# Patient Record
Sex: Female | Born: 1995
Health system: Southern US, Community
[De-identification: ages and names within clinical notes are randomized; demographics above are authoritative.]

---
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
## 2016-07-07 DIAGNOSIS — H16421 Pannus (corneal), right eye: Secondary | ICD-10-CM | POA: Diagnosis not present

## 2016-07-07 DIAGNOSIS — H10413 Chronic giant papillary conjunctivitis, bilateral: Secondary | ICD-10-CM | POA: Diagnosis not present

## 2016-07-07 DIAGNOSIS — H17821 Peripheral opacity of cornea, right eye: Secondary | ICD-10-CM | POA: Diagnosis not present

## 2016-08-24 ENCOUNTER — Ambulatory Visit (INDEPENDENT_AMBULATORY_CARE_PROVIDER_SITE_OTHER): Payer: BLUE CROSS/BLUE SHIELD | Admitting: Family Medicine

## 2016-08-24 ENCOUNTER — Encounter: Payer: Self-pay | Admitting: Family Medicine

## 2016-08-24 DIAGNOSIS — Z0000 Encounter for general adult medical examination without abnormal findings: Secondary | ICD-10-CM

## 2016-08-24 LAB — BASIC METABOLIC PANEL
BUN: 10 mg/dL (ref 6–23)
CHLORIDE: 102 meq/L (ref 96–112)
CO2: 25 meq/L (ref 19–32)
CREATININE: 0.64 mg/dL (ref 0.40–1.20)
Calcium: 9.6 mg/dL (ref 8.4–10.5)
GFR: 125.72 mL/min (ref >60.00)
Glucose, Bld: 92 mg/dL (ref 70–99)
POTASSIUM: 4.4 meq/L (ref 3.5–5.1)
Sodium: 135 mEq/L (ref 135–145)

## 2016-08-24 LAB — CBC WITH DIFFERENTIAL/PLATELET
BASOS PCT: 1.1 % (ref 0.0–3.0)
Basophils Absolute: 0.1 10*3/uL (ref 0.0–0.1)
EOS ABS: 0.1 10*3/uL (ref 0.0–0.7)
EOS PCT: 2.3 % (ref 0.0–5.0)
HCT: 36.5 % (ref 36.0–46.0)
HEMOGLOBIN: 12.4 g/dL (ref 12.0–15.0)
LYMPHS ABS: 2.1 10*3/uL (ref 0.7–4.0)
Lymphocytes Relative: 34.9 % (ref 12.0–46.0)
MCHC: 34 g/dL (ref 30.0–36.0)
MCV: 75.5 fl — ABNORMAL LOW (ref 78.0–100.0)
MONO ABS: 0.7 10*3/uL (ref 0.1–1.0)
Monocytes Relative: 11.1 % (ref 3.0–12.0)
NEUTROS PCT: 50.6 % (ref 43.0–77.0)
Neutro Abs: 3.1 10*3/uL (ref 1.4–7.7)
Platelets: 282 10*3/uL (ref 150.0–400.0)
RBC: 4.83 Mil/uL (ref 3.87–5.11)
RDW: 15.8 % — AB (ref 11.5–14.6)
WBC: 6.1 10*3/uL (ref 4.5–10.5)

## 2016-08-24 LAB — LIPID PANEL
CHOLESTEROL: 171 mg/dL (ref 0–200)
HDL: 44.3 mg/dL (ref >39.00)
LDL Cholesterol: 101 mg/dL — ABNORMAL HIGH (ref 0–99)
NonHDL: 126.37
TRIGLYCERIDES: 125 mg/dL (ref 0.0–149.0)
Total CHOL/HDL Ratio: 4
VLDL: 25 mg/dL (ref 0.0–40.0)

## 2016-08-24 LAB — HEPATIC FUNCTION PANEL
ALBUMIN: 4.2 g/dL (ref 3.5–5.2)
ALK PHOS: 52 U/L (ref 39–117)
ALT: 11 U/L (ref 0–35)
AST: 22 U/L (ref 0–37)
Bilirubin, Direct: 0.1 mg/dL (ref 0.0–0.3)
Total Bilirubin: 0.4 mg/dL (ref 0.2–1.2)
Total Protein: 7.4 g/dL (ref 6.0–8.3)

## 2016-08-24 LAB — TSH: TSH: 2.33 u[IU]/mL (ref 0.35–5.50)

## 2016-08-24 LAB — VITAMIN D 25 HYDROXY (VIT D DEFICIENCY, FRACTURES): VITD: 13.53 ng/mL — AB (ref 30.00–100.00)

## 2016-08-24 MED ORDER — NORETHINDRONE ACET-ETHINYL EST 1-20 MG-MCG PO TABS: 1.0000 | ORAL_TABLET | Freq: Every day | ORAL | 11 refills | Status: DC

## 2016-08-24 NOTE — Progress Notes (Signed)
   Subjective:    Patient ID: Sara Hartman, female    DOB: 03/17/1996, 21 y.o.   MRN: [id number]  HPI New to establish.  Previous MD- [facility].    CPE- no concerns today.  UTD on immunizations.  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  Not currently on OCPs- interested in starting.  Feeling safe at school, not currently in a relationship.  Drinking at school but not using any drugs.     Review of Systems Patient reports no vision/ hearing changes, adenopathy,fever, weight change,  persistant/recurrent hoarseness , swallowing issues, chest pain, palpitations, edema, persistant/recurrent cough, hemoptysis, dyspnea (rest/exertional/paroxysmal nocturnal), gastrointestinal bleeding (melena, rectal bleeding), abdominal pain, significant heartburn, bowel changes, GU symptoms (dysuria, hematuria, incontinence), Gyn symptoms (abnormal  bleeding, pain),  syncope, focal weakness, memory loss, numbness & tingling, skin/hair/nail changes, abnormal bruising or bleeding, anxiety, or depression.     Objective:   Physical Exam General Appearance:    Alert, cooperative, no distress, appears stated age  Head:    Normocephalic, without obvious abnormality, atraumatic  Eyes:    PERRL, conjunctiva/corneas clear, EOM's intact, fundi    benign, both eyes  Ears:    Normal TM's and external ear canals, both ears  Nose:   Nares normal, septum midline, mucosa normal, no drainage    or sinus tenderness  Throat:   Lips, mucosa, and tongue normal; teeth and gums normal  Neck:   Supple, symmetrical, trachea midline, no adenopathy;    Thyroid: no enlargement/tenderness/nodules  Back:     Symmetric, no curvature, ROM normal, no CVA tenderness  Lungs:     Clear to auscultation bilaterally, respirations unlabored  Chest Wall:    No tenderness or deformity   Heart:    Regular rate and rhythm, S1 and S2 normal, no murmur, rub   or gallop  Breast Exam:    Deferred  Abdomen:     Soft, non-tender, bowel sounds active all four  quadrants,    no masses, no organomegaly  Genitalia:    Deferred  Rectal:    Extremities:   Extremities normal, atraumatic, no cyanosis or edema  Pulses:   2+ and symmetric all extremities  Skin:   Skin color, texture, turgor normal, no rashes or lesions  Lymph nodes:   Cervical, supraclavicular, and axillary nodes normal  Neurologic:   CNII-XII intact, normal strength, sensation and reflexes    throughout          Assessment & Plan:  Physical exam- pt's PE WNL w/ exception of obesity.  Check baseline labs.  Discussed need to start paps next year.  UTD on immunizations.  Start OCPs.  Anticipatory guidance provided.

## 2016-08-24 NOTE — Progress Notes (Signed)
Pre visit review using our clinic review tool, if applicable. No additional management support is needed unless otherwise documented below in the visit note. 

## 2016-08-24 NOTE — Patient Instructions (Signed)
Follow up in 1 year or as needed We'll notify you of your lab results and make any changes if needed Keep up the good work on healthy diet and regular exercise! Start the birth control pills on Sunday after your start your next cycle (likely this Sunday) Call with any questions or concerns Have a great semester! Welcome!  We're glad to have you!

## 2016-08-26 ENCOUNTER — Other Ambulatory Visit: Payer: Self-pay | Admitting: General Practice

## 2016-08-26 ENCOUNTER — Encounter: Payer: Self-pay | Admitting: General Practice

## 2016-08-26 MED ORDER — VITAMIN D (ERGOCALCIFEROL) 1.25 MG (50000 UNIT) PO CAPS: 50000.0000 [IU] | ORAL_CAPSULE | ORAL | 0 refills | Status: DC

## 2016-11-15 ENCOUNTER — Other Ambulatory Visit: Payer: Self-pay | Admitting: Family Medicine

## 2017-01-07 ENCOUNTER — Telehealth: Payer: Self-pay | Admitting: Family Medicine

## 2017-01-07 ENCOUNTER — Encounter: Payer: Self-pay | Admitting: Family Medicine

## 2017-01-07 MED ORDER — NORETHIN-ETH ESTRAD-FE BIPHAS 1 MG-10 MCG / 10 MCG PO TABS: 1.0000 | ORAL_TABLET | Freq: Every day | ORAL | 11 refills | Status: DC

## 2017-01-07 NOTE — Telephone Encounter (Signed)
Pt dropped off school form to be completed for travel in the fall, placed with charge sheet in bin upfront.

## 2017-01-11 ENCOUNTER — Other Ambulatory Visit: Payer: Self-pay | Admitting: General Practice

## 2017-01-11 NOTE — Telephone Encounter (Signed)
Paperwork completed and placed in PCP folder for signature.  

## 2017-01-12 ENCOUNTER — Encounter: Payer: Self-pay | Admitting: Family Medicine

## 2017-01-12 NOTE — Telephone Encounter (Signed)
LMOVM making pt aware that form is ready for pick up.

## 2017-01-12 NOTE — Telephone Encounter (Signed)
Form completed and placed in basket  

## 2017-01-20 ENCOUNTER — Other Ambulatory Visit: Payer: Self-pay | Admitting: General Practice

## 2017-01-20 ENCOUNTER — Other Ambulatory Visit: Payer: Self-pay | Admitting: Family Medicine

## 2017-01-20 DIAGNOSIS — Z308 Encounter for other contraceptive management: Secondary | ICD-10-CM

## 2017-01-20 MED ORDER — NORETHIN-ETH ESTRAD-FE BIPHAS 1 MG-10 MCG / 10 MCG PO TABS: 1.0000 | ORAL_TABLET | Freq: Every day | ORAL | 0 refills | Status: DC

## 2017-03-24 DIAGNOSIS — H10413 Chronic giant papillary conjunctivitis, bilateral: Secondary | ICD-10-CM | POA: Diagnosis not present

## 2017-03-29 DIAGNOSIS — Z01419 Encounter for gynecological examination (general) (routine) without abnormal findings: Secondary | ICD-10-CM | POA: Diagnosis not present

## 2017-03-29 DIAGNOSIS — Z3009 Encounter for other general counseling and advice on contraception: Secondary | ICD-10-CM | POA: Diagnosis not present

## 2017-03-29 DIAGNOSIS — D509 Iron deficiency anemia, unspecified: Secondary | ICD-10-CM | POA: Diagnosis not present

## 2017-04-06 ENCOUNTER — Other Ambulatory Visit: Payer: Self-pay | Admitting: Family Medicine

## 2017-04-06 ENCOUNTER — Encounter: Payer: Self-pay | Admitting: Family Medicine

## 2017-04-06 MED ORDER — NORETHIN-ETH ESTRAD-FE BIPHAS 1 MG-10 MCG / 10 MCG PO TABS: 1.0000 | ORAL_TABLET | Freq: Every day | ORAL | 0 refills | Status: DC

## 2017-04-11 ENCOUNTER — Encounter: Payer: Self-pay | Admitting: Family Medicine

## 2017-04-11 ENCOUNTER — Ambulatory Visit (INDEPENDENT_AMBULATORY_CARE_PROVIDER_SITE_OTHER): Payer: BLUE CROSS/BLUE SHIELD | Admitting: Family Medicine

## 2017-04-11 DIAGNOSIS — L709 Acne, unspecified: Secondary | ICD-10-CM | POA: Insufficient documentation

## 2017-04-11 DIAGNOSIS — L700 Acne vulgaris: Secondary | ICD-10-CM

## 2017-04-11 MED ORDER — BENZOYL PEROXIDE 10 % EX CREA: TOPICAL_CREAM | CUTANEOUS | 1 refills | Status: DC

## 2017-04-11 NOTE — Patient Instructions (Signed)
Follow up as needed Start w/ once daily application of benzoyl peroxide and increase to twice daily as needed Continue to wash your face twice daily Call with any questions or concerns- particularly if excessive redness, peeling, or burning ENJOY YOUR SEMESTER!!

## 2017-04-11 NOTE — Progress Notes (Signed)
Pre visit review using our clinic review tool, if applicable. No additional management support is needed unless otherwise documented below in the visit note. 

## 2017-04-11 NOTE — Assessment & Plan Note (Signed)
New.  Pt has not tried topical OTC products so before jumping to a combination product, will start Benzoyl peroxide.  Pt expressed understanding and is in agreement w/ plan. r

## 2017-04-11 NOTE — Progress Notes (Signed)
   Subjective:    Patient ID: Sara Hartman, female    DOB: 08-Apr-1996, 21 y.o.   MRN: [id number]  HPI Acne- pt reports she has only had intermittent issues w/ acne, typically during warmer weather.  Pediatrician has prescribed topical tx in the past but unsure of what.  Biggest issues is forehead.  No issue w/ body acne.  No issues w/ cystic or infected acne.  Currently using Kiehl's face wash.     Review of Systems For ROS see HPI     Objective:   Physical Exam  Constitutional: She appears well-developed and well-nourished. No distress.  HENT:  Head: Normocephalic and atraumatic.  Skin: Skin is warm and dry.  Mild acne on forehead- closed comedones w/ no sign of inflammation or infection  Vitals reviewed.         Assessment & Plan:

## 2017-04-13 ENCOUNTER — Other Ambulatory Visit: Payer: Self-pay | Admitting: General Practice

## 2017-04-13 MED ORDER — NORETHIN-ETH ESTRAD-FE BIPHAS 1 MG-10 MCG / 10 MCG PO TABS: 1.0000 | ORAL_TABLET | Freq: Every day | ORAL | 3 refills | Status: DC

## 2017-06-19 ENCOUNTER — Other Ambulatory Visit: Payer: Self-pay | Admitting: Family Medicine

## 2017-09-07 ENCOUNTER — Other Ambulatory Visit: Payer: Self-pay | Admitting: Family Medicine

## 2017-09-30 ENCOUNTER — Encounter: Payer: Self-pay | Admitting: General Practice

## 2017-09-30 ENCOUNTER — Encounter: Payer: Self-pay | Admitting: Family Medicine

## 2017-11-30 ENCOUNTER — Other Ambulatory Visit: Payer: Self-pay | Admitting: Family Medicine

## 2018-02-22 ENCOUNTER — Other Ambulatory Visit: Payer: Self-pay | Admitting: Family Medicine

## 2018-05-10 ENCOUNTER — Encounter: Payer: Self-pay | Admitting: Family Medicine

## 2018-05-17 ENCOUNTER — Other Ambulatory Visit: Payer: Self-pay | Admitting: Family Medicine

## 2018-08-04 ENCOUNTER — Other Ambulatory Visit: Payer: Self-pay | Admitting: Family Medicine

## 2018-08-04 NOTE — Telephone Encounter (Signed)
Copied from CRM [id number]. Topic: Quick Communication - Rx Refill/Question >> Aug 04, 2018 10:44 AM [name], [name] wrote: Medication:LO LOESTRIN FE 1 MG-10 MCG / 10 MCG tablet [[id number]][[id number]]   Has the patient contacted their pharmacy? no Preferred Pharmacy (with phone number or street name): [facility] [place] [address] [phone] (Phone) [phone] (Fax)  Agent: Please be advised that RX refills may take up to 3 business days. We ask that you follow-up with your pharmacy.

## 2018-08-04 NOTE — Telephone Encounter (Signed)
Refill request for loestrin FE; refill note dated 05/17/18 prescription refused because pt needs appointment; last office visit 04/11/17;  no upcoming noted; left message on voicemail [phone]

## 2018-08-07 ENCOUNTER — Encounter: Payer: Self-pay | Admitting: Family Medicine

## 2018-08-07 ENCOUNTER — Other Ambulatory Visit: Payer: Self-pay

## 2018-08-07 ENCOUNTER — Ambulatory Visit (INDEPENDENT_AMBULATORY_CARE_PROVIDER_SITE_OTHER): Payer: BLUE CROSS/BLUE SHIELD | Admitting: Family Medicine

## 2018-08-07 VITALS — BP 112/72 | HR 72 | Temp 98.4°F | Resp 16 | Ht 65.0 in | Wt 183.5 lb

## 2018-08-07 DIAGNOSIS — Z6830 Body mass index (BMI) 30.0-30.9, adult: Secondary | ICD-10-CM

## 2018-08-07 DIAGNOSIS — Z0000 Encounter for general adult medical examination without abnormal findings: Secondary | ICD-10-CM

## 2018-08-07 DIAGNOSIS — E6609 Other obesity due to excess calories: Secondary | ICD-10-CM

## 2018-08-07 DIAGNOSIS — E559 Vitamin D deficiency, unspecified: Secondary | ICD-10-CM | POA: Diagnosis not present

## 2018-08-07 MED ORDER — NORETHIN-ETH ESTRAD-FE BIPHAS 1 MG-10 MCG / 10 MCG PO TABS: 1.0000 | ORAL_TABLET | Freq: Every day | ORAL | 1 refills | Status: DC

## 2018-08-07 MED ORDER — NORETHIN-ETH ESTRAD-FE BIPHAS 1 MG-10 MCG / 10 MCG PO TABS: 1.0000 | ORAL_TABLET | Freq: Every day | ORAL | 0 refills | Status: DC

## 2018-08-07 NOTE — Assessment & Plan Note (Signed)
Pt has hx of this.  Check labs and replete prn. 

## 2018-08-07 NOTE — Patient Instructions (Signed)
Follow up in 1 year or as needed We'll notify you of your lab results and make any changes if needed Keep up the good work on healthy diet and regular exercise- you look great! Call and schedule your pap with GYN If you have not had a tetanus shot since 2009, you are due Call with any questions or concerns Happy Holidays!

## 2018-08-07 NOTE — Progress Notes (Signed)
   Subjective:    Patient ID: Sara Hartman, female    DOB: 04/09/1996, 22 y.o.   MRN: [id number]  HPI CPE- Due for Tdap.  Has GYN- due to start pap smears.  UTD on flu shot.   Review of Systems Patient reports no vision/ hearing changes, adenopathy,fever, weight change,  persistant/recurrent hoarseness , swallowing issues, chest pain, palpitations, edema, persistant/recurrent cough, hemoptysis, dyspnea (rest/exertional/paroxysmal nocturnal), gastrointestinal bleeding (melena, rectal bleeding), abdominal pain, significant heartburn, bowel changes, GU symptoms (dysuria, hematuria, incontinence), Gyn symptoms (abnormal  bleeding, pain),  syncope, focal weakness, memory loss, numbness & tingling, skin/hair/nail changes, abnormal bruising or bleeding, anxiety, or depression.     Objective:   Physical Exam General Appearance:    Alert, cooperative, no distress, appears stated age  Head:    Normocephalic, without obvious abnormality, atraumatic  Eyes:    PERRL, conjunctiva/corneas clear, EOM's intact, fundi    benign, both eyes  Ears:    Normal TM's and external ear canals, both ears  Nose:   Nares normal, septum midline, mucosa normal, no drainage    or sinus tenderness  Throat:   Lips, mucosa, and tongue normal; teeth and gums normal  Neck:   Supple, symmetrical, trachea midline, no adenopathy;    Thyroid: no enlargement/tenderness/nodules  Back:     Symmetric, no curvature, ROM normal, no CVA tenderness  Lungs:     Clear to auscultation bilaterally, respirations unlabored  Chest Wall:    No tenderness or deformity   Heart:    Regular rate and rhythm, S1 and S2 normal, no murmur, rub   or gallop  Breast Exam:    Deferred to GYN  Abdomen:     Soft, non-tender, bowel sounds active all four quadrants,    no masses, no organomegaly  Genitalia:    Deferred to GYN  Rectal:    Extremities:   Extremities normal, atraumatic, no cyanosis or edema  Pulses:   2+ and symmetric all extremities  Skin:    Skin color, texture, turgor normal, no rashes or lesions  Lymph nodes:   Cervical, supraclavicular, and axillary nodes normal  Neurologic:   CNII-XII intact, normal strength, sensation and reflexes    throughout          Assessment & Plan:

## 2018-08-07 NOTE — Addendum Note (Signed)
Addended by: Laddie AquasNELSON, Jaiyon Wander A on: 08/07/2018 03:02 PM   Modules accepted: Orders

## 2018-08-07 NOTE — Assessment & Plan Note (Signed)
Pt's PE WNL.  Applauded her weight loss efforts.  Our records show she is due for Tdap- pt wants to check w/ mom first.  Due to start paps- pt will call GYN to schedule.  Check labs.  Anticipatory guidance provided.

## 2018-08-11 ENCOUNTER — Other Ambulatory Visit (INDEPENDENT_AMBULATORY_CARE_PROVIDER_SITE_OTHER): Payer: BLUE CROSS/BLUE SHIELD

## 2018-08-11 ENCOUNTER — Encounter: Payer: Self-pay | Admitting: Family Medicine

## 2018-08-11 DIAGNOSIS — Z23 Encounter for immunization: Secondary | ICD-10-CM | POA: Diagnosis not present

## 2018-08-11 LAB — TSH: TSH: 0.87 u[IU]/mL (ref 0.35–4.50)

## 2018-08-11 LAB — CBC WITH DIFFERENTIAL/PLATELET
Basophils Absolute: 0 10*3/uL (ref 0.0–0.1)
Basophils Relative: 0.5 % (ref 0.0–3.0)
EOS ABS: 0.2 10*3/uL (ref 0.0–0.7)
Eosinophils Relative: 2.7 % (ref 0.0–5.0)
HEMATOCRIT: 41.6 % (ref 36.0–46.0)
Hemoglobin: 13.7 g/dL (ref 12.0–15.0)
LYMPHS ABS: 2.2 10*3/uL (ref 0.7–4.0)
LYMPHS PCT: 38 % (ref 12.0–46.0)
MCHC: 32.9 g/dL (ref 30.0–36.0)
MCV: 85.4 fl (ref 78.0–100.0)
Monocytes Absolute: 0.4 10*3/uL (ref 0.1–1.0)
Monocytes Relative: 7.4 % (ref 3.0–12.0)
NEUTROS ABS: 3 10*3/uL (ref 1.4–7.7)
NEUTROS PCT: 51.4 % (ref 43.0–77.0)
PLATELETS: 218 10*3/uL (ref 150.0–400.0)
RBC: 4.88 Mil/uL (ref 3.87–5.11)
RDW: 14.1 % (ref 11.5–15.5)
WBC: 5.8 10*3/uL (ref 4.0–10.5)

## 2018-08-11 LAB — LIPID PANEL
Cholesterol: 191 mg/dL (ref 0–200)
HDL: 59 mg/dL (ref >39.00)
LDL Cholesterol: 100 mg/dL — ABNORMAL HIGH (ref 0–99)
NONHDL: 131.72
Total CHOL/HDL Ratio: 3
Triglycerides: 160 mg/dL — ABNORMAL HIGH (ref 0.0–149.0)
VLDL: 32 mg/dL (ref 0.0–40.0)

## 2018-08-11 LAB — BASIC METABOLIC PANEL
BUN: 11 mg/dL (ref 6–23)
CHLORIDE: 101 meq/L (ref 96–112)
CO2: 21 meq/L (ref 19–32)
Calcium: 9.6 mg/dL (ref 8.4–10.5)
Creatinine, Ser: 0.71 mg/dL (ref 0.40–1.20)
GFR: 109.43 mL/min (ref >60.00)
Glucose, Bld: 97 mg/dL (ref 70–99)
POTASSIUM: 4.1 meq/L (ref 3.5–5.1)
SODIUM: 132 meq/L — AB (ref 135–145)

## 2018-08-11 LAB — HEPATIC FUNCTION PANEL
ALK PHOS: 44 U/L (ref 39–117)
ALT: 13 U/L (ref 0–35)
AST: 19 U/L (ref 0–37)
Albumin: 4.2 g/dL (ref 3.5–5.2)
BILIRUBIN DIRECT: 0.1 mg/dL (ref 0.0–0.3)
BILIRUBIN TOTAL: 0.4 mg/dL (ref 0.2–1.2)
Total Protein: 7.4 g/dL (ref 6.0–8.3)

## 2018-08-11 LAB — VITAMIN D 25 HYDROXY (VIT D DEFICIENCY, FRACTURES): VITD: 20.92 ng/mL — ABNORMAL LOW (ref 30.00–100.00)

## 2018-08-11 NOTE — Progress Notes (Signed)
Administered Tdap vaccine IM lt arm. Patient tolerated well.

## 2018-08-11 NOTE — Addendum Note (Signed)
Addended by: Laddie AquasNELSON, Dynisha Due A on: 08/11/2018 03:57 PM   Modules accepted: Orders

## 2018-08-14 ENCOUNTER — Other Ambulatory Visit: Payer: Self-pay | Admitting: General Practice

## 2018-08-14 MED ORDER — VITAMIN D (ERGOCALCIFEROL) 1.25 MG (50000 UNIT) PO CAPS: 50000.0000 [IU] | ORAL_CAPSULE | ORAL | 0 refills | Status: DC

## 2018-08-28 ENCOUNTER — Other Ambulatory Visit: Payer: Self-pay | Admitting: Family Medicine

## 2018-12-03 ENCOUNTER — Encounter: Payer: Self-pay | Admitting: Family Medicine

## 2018-12-27 ENCOUNTER — Encounter: Payer: Self-pay | Admitting: Family Medicine

## 2018-12-29 ENCOUNTER — Other Ambulatory Visit: Payer: Self-pay | Admitting: *Deleted

## 2018-12-29 DIAGNOSIS — Z3009 Encounter for other general counseling and advice on contraception: Secondary | ICD-10-CM

## 2019-01-22 ENCOUNTER — Other Ambulatory Visit: Payer: Self-pay | Admitting: Family Medicine

## 2019-02-09 ENCOUNTER — Other Ambulatory Visit: Payer: Self-pay

## 2019-02-09 ENCOUNTER — Ambulatory Visit: Payer: Self-pay | Admitting: *Deleted

## 2019-02-09 ENCOUNTER — Encounter: Payer: Self-pay | Admitting: Family Medicine

## 2019-02-09 ENCOUNTER — Encounter (INDEPENDENT_AMBULATORY_CARE_PROVIDER_SITE_OTHER): Payer: Self-pay

## 2019-02-09 ENCOUNTER — Ambulatory Visit (INDEPENDENT_AMBULATORY_CARE_PROVIDER_SITE_OTHER): Payer: 59 | Admitting: Family Medicine

## 2019-02-09 VITALS — Ht 65.0 in | Wt 175.0 lb

## 2019-02-09 DIAGNOSIS — Z20828 Contact with and (suspected) exposure to other viral communicable diseases: Secondary | ICD-10-CM

## 2019-02-09 DIAGNOSIS — Z20822 Contact with and (suspected) exposure to covid-19: Secondary | ICD-10-CM

## 2019-02-09 NOTE — Telephone Encounter (Signed)
Pt has been scheduled for a VV this afternoon with KT

## 2019-02-09 NOTE — Telephone Encounter (Signed)
Will discuss with PT at appt.

## 2019-02-09 NOTE — Progress Notes (Signed)
I have discussed the procedure for the virtual visit with the patient who has given consent to proceed with assessment and treatment.   [name] Brodmerkel, CMA     

## 2019-02-09 NOTE — Progress Notes (Signed)
   Virtual Visit via Video   I connected with patient on 02/09/19 at  2:20 PM EDT by a video enabled telemedicine application and verified that I am speaking with the correct person using two identifiers.  Location patient: Home Location provider: [facility], Office Persons participating in the virtual visit: Patient, Provider, [place] (Jess B)  I discussed the limitations of evaluation and management by telemedicine and the availability of in person appointments. The patient expressed understanding and agreed to proceed.  Subjective:   HPI:   COVID exposure- pt went to the beach last weekend and one of the girls on the trip was exposed to [place] prior to the beach.  She is waiting on test results.  Pt would like to get tested to know if she is a carrier.  No fever, cough, SOB, HAs, body aches, N/V/D.  ROS:   See pertinent positives and negatives per HPI.  Patient Active Problem List   Diagnosis Date Noted  . Vitamin D deficiency 08/07/2018  . Acne 04/11/2017  . Physical exam 08/24/2016  . Pes planus 02/13/2012  . Patellofemoral arthralgia of both knees 02/11/2012    Social History   Tobacco Use  . Smoking status: Never Smoker  . Smokeless tobacco: Never Used  Substance Use Topics  . Alcohol use: Yes    Comment: socially at school    Current Outpatient Medications:  .  levonorgestrel (MIRENA) 20 MCG/24HR IUD, 1 each by Intrauterine route once., Disp: , Rfl:   Not on File  Objective:   Ht 5\' 5"  (1.651 m)   Wt 175 lb (79.4 kg)   BMI 29.12 kg/m   AAOx3, NAD NCAT, EOMI No obvious CN deficits Coloring WNL Pt is able to speak clearly, coherently without shortness of breath or increased work of breathing.  Thought process is linear.  Mood is appropriate.   Assessment and Plan:   Exposure to COVID- pt is currently asymptomatic but someone she travelled w/ has known exposure and is currently waiting on test results.  Pt is asking for testing.  Order sent to [facility]  and pt was enrolled in home monitoring program.  Pt is aware she needs to self isolate while waiting for test results.   Annye Asa, MD 02/09/2019

## 2019-02-09 NOTE — Telephone Encounter (Signed)
  Pt called in requesting to be tested for COVID-19 because her co-worker was exposed to someone that was positive for COVID-19.   Pt has been in contact with her co-worker.   She does not have any symptoms.  I sent this note to the office of Dr. Birdie Riddle.   I let the pt know as soon as the office opened someone would be contacting her.  I sent my notes to the office for further disposition.  Reason for Disposition . [1] COVID-19 EXPOSURE (Close Contact) AND [2] within last 14 days BUT [3] NO symptoms  Answer Assessment - Initial Assessment Questions 1. CLOSE CONTACT: "Who is the person with the confirmed or suspected COVID-19 infection that you were exposed to?"     My co-worker was exposed to someone who is positive for the COVID-19 and I've been in close contact with my co-worker.   I don't have any symptoms but I want to be tested to be sure I don't have it. 2. PLACE of CONTACT: "Where were you when you were exposed to COVID-19?" (e.g., home, school, medical waiting room; which city?)     My co-worker 3. TYPE of CONTACT: "How much contact was there?" (e.g., sitting next to, live in same house, work in same office, same building)     On my job 4. DURATION of CONTACT: "How long were you in contact with the COVID-19 patient?" (e.g., a few seconds, passed by person, a few minutes, live with the patient)     Daily at work 5. DATE of CONTACT: "When did you have contact with a COVID-19 patient?" (e.g., how many days ago)     Work 6. TRAVEL: "Have you traveled out of the country recently?" If so, "When and where?"     * Also ask about out-of-state travel, since the CDC has identified some high-risk cities for community spread in the [place].     * Note: Travel becomes less relevant if there is widespread community transmission where the patient lives.     No 7. COMMUNITY SPREAD: "Are there lots of cases of COVID-19 (community spread) where you live?" (See public health department website, if unsure)       Yes 8. SYMPTOMS: "Do you have any symptoms?" (e.g., fever, cough, breathing difficulty)     No 9. PREGNANCY OR POSTPARTUM: "Is there any chance you are pregnant?" "When was your last menstrual period?" "Did you deliver in the last 2 weeks?"     Not asked 10. HIGH RISK: "Do you have any heart or lung problems? Do you have a weak immune system?" (e.g., CHF, COPD, asthma, HIV positive, chemotherapy, renal failure, diabetes mellitus, sickle cell anemia)       No  Protocols used: CORONAVIRUS (COVID-19) EXPOSURE-A-AH

## 2019-02-10 ENCOUNTER — Telehealth: Payer: Self-pay

## 2019-02-10 ENCOUNTER — Encounter (INDEPENDENT_AMBULATORY_CARE_PROVIDER_SITE_OTHER): Payer: Self-pay

## 2019-02-10 DIAGNOSIS — Z20822 Contact with and (suspected) exposure to covid-19: Secondary | ICD-10-CM

## 2019-02-10 NOTE — Telephone Encounter (Signed)
Called pt and scheduled Covid 19 testing. Pt given appt 0930 June 29th. Pt informed of address, wear a mask to testing site and that this is a drive up testing and to stay in car.

## 2019-02-11 ENCOUNTER — Encounter (INDEPENDENT_AMBULATORY_CARE_PROVIDER_SITE_OTHER): Payer: Self-pay

## 2019-02-12 ENCOUNTER — Encounter (INDEPENDENT_AMBULATORY_CARE_PROVIDER_SITE_OTHER): Payer: Self-pay

## 2019-02-12 ENCOUNTER — Other Ambulatory Visit: Payer: 59

## 2019-02-12 DIAGNOSIS — Z20822 Contact with and (suspected) exposure to covid-19: Secondary | ICD-10-CM

## 2019-02-13 ENCOUNTER — Encounter (INDEPENDENT_AMBULATORY_CARE_PROVIDER_SITE_OTHER): Payer: Self-pay

## 2019-02-14 ENCOUNTER — Encounter (INDEPENDENT_AMBULATORY_CARE_PROVIDER_SITE_OTHER): Payer: Self-pay

## 2019-02-15 ENCOUNTER — Encounter (INDEPENDENT_AMBULATORY_CARE_PROVIDER_SITE_OTHER): Payer: Self-pay

## 2019-02-15 LAB — NOVEL CORONAVIRUS, NAA: SARS-CoV-2, NAA: NOT DETECTED

## 2019-02-19 ENCOUNTER — Encounter (INDEPENDENT_AMBULATORY_CARE_PROVIDER_SITE_OTHER): Payer: Self-pay

## 2019-08-14 ENCOUNTER — Encounter: Payer: Self-pay | Admitting: Family Medicine

## 2019-08-14 ENCOUNTER — Other Ambulatory Visit: Payer: Self-pay

## 2019-08-14 ENCOUNTER — Ambulatory Visit (INDEPENDENT_AMBULATORY_CARE_PROVIDER_SITE_OTHER): Payer: 59 | Admitting: Family Medicine

## 2019-08-14 VITALS — BP 98/78 | HR 74 | Temp 97.2°F | Resp 16 | Ht 65.0 in | Wt 185.4 lb

## 2019-08-14 DIAGNOSIS — E559 Vitamin D deficiency, unspecified: Secondary | ICD-10-CM | POA: Diagnosis not present

## 2019-08-14 DIAGNOSIS — Z0000 Encounter for general adult medical examination without abnormal findings: Secondary | ICD-10-CM | POA: Diagnosis not present

## 2019-08-14 DIAGNOSIS — E669 Obesity, unspecified: Secondary | ICD-10-CM | POA: Diagnosis not present

## 2019-08-14 LAB — BASIC METABOLIC PANEL
BUN: 10 mg/dL (ref 6–23)
CO2: 29 mEq/L (ref 19–32)
Calcium: 9.5 mg/dL (ref 8.4–10.5)
Chloride: 103 mEq/L (ref 96–112)
Creatinine, Ser: 0.65 mg/dL (ref 0.40–1.20)
GFR: 112.97 mL/min (ref >60.00)
Glucose, Bld: 87 mg/dL (ref 70–99)
Potassium: 4.4 mEq/L (ref 3.5–5.1)
Sodium: 136 mEq/L (ref 135–145)

## 2019-08-14 LAB — HEPATIC FUNCTION PANEL
ALT: 11 U/L (ref 0–35)
AST: 15 U/L (ref 0–37)
Albumin: 4.3 g/dL (ref 3.5–5.2)
Alkaline Phosphatase: 57 U/L (ref 39–117)
Bilirubin, Direct: 0.1 mg/dL (ref 0.0–0.3)
Total Bilirubin: 0.5 mg/dL (ref 0.2–1.2)
Total Protein: 6.9 g/dL (ref 6.0–8.3)

## 2019-08-14 LAB — CBC WITH DIFFERENTIAL/PLATELET
Basophils Absolute: 0 10*3/uL (ref 0.0–0.1)
Basophils Relative: 0.6 % (ref 0.0–3.0)
Eosinophils Absolute: 0.2 10*3/uL (ref 0.0–0.7)
Eosinophils Relative: 4.2 % (ref 0.0–5.0)
HCT: 38.9 % (ref 36.0–46.0)
Hemoglobin: 12.7 g/dL (ref 12.0–15.0)
Lymphocytes Relative: 41.2 % (ref 12.0–46.0)
Lymphs Abs: 2 10*3/uL (ref 0.7–4.0)
MCHC: 32.6 g/dL (ref 30.0–36.0)
MCV: 85 fl (ref 78.0–100.0)
Monocytes Absolute: 0.4 10*3/uL (ref 0.1–1.0)
Monocytes Relative: 8.9 % (ref 3.0–12.0)
Neutro Abs: 2.2 10*3/uL (ref 1.4–7.7)
Neutrophils Relative %: 45.1 % (ref 43.0–77.0)
Platelets: 227 10*3/uL (ref 150.0–400.0)
RBC: 4.58 Mil/uL (ref 3.87–5.11)
RDW: 14.1 % (ref 11.5–15.5)
WBC: 4.8 10*3/uL (ref 4.0–10.5)

## 2019-08-14 LAB — LIPID PANEL
Cholesterol: 179 mg/dL (ref 0–200)
HDL: 56.3 mg/dL (ref >39.00)
LDL Cholesterol: 101 mg/dL — ABNORMAL HIGH (ref 0–99)
NonHDL: 122.55
Total CHOL/HDL Ratio: 3
Triglycerides: 107 mg/dL (ref 0.0–149.0)
VLDL: 21.4 mg/dL (ref 0.0–40.0)

## 2019-08-14 LAB — TSH: TSH: 0.93 u[IU]/mL (ref 0.35–4.50)

## 2019-08-14 LAB — VITAMIN D 25 HYDROXY (VIT D DEFICIENCY, FRACTURES): VITD: 12.46 ng/mL — ABNORMAL LOW (ref 30.00–100.00)

## 2019-08-14 NOTE — Assessment & Plan Note (Signed)
Pt has gained 10 lbs since last visit.  Encouraged healthy diet and regular exercise.  Check labs to risk stratify.  Will follow. ?

## 2019-08-14 NOTE — Assessment & Plan Note (Signed)
Pt's PE WNL w/ exception of obesity.  UTD on immunizations, GYN.  Check labs.  Anticipatory guidance provided.

## 2019-08-14 NOTE — Patient Instructions (Signed)
Follow up in 1 year or as needed ?We'll notify you of your lab results and make any changes if needed ?Keep up the good work on healthy diet and regular exercise- you're doing great!!! ?Call with any questions or concerns ?Stay Safe!  Stay Healthy! ?Happy Early Birthday!!! ?

## 2019-08-14 NOTE — Progress Notes (Signed)
   Subjective:    Patient ID: Sara Hartman, female    DOB: 1996-06-30, 23 y.o.   MRN: [id number]  HPI CPE- UTD on immunizations, GYN.  Living in [place], doing political research.  Living w/ roomates- safe in living situation.  Not currently dating.  No concerns for STIs.  Continues to exercise 4-5x/week.  Not smoking or vaping.  No drug use.  Alcohol in moderation.  No concerns today.   Review of Systems Patient reports no vision/ hearing changes, adenopathy,fever, weight change,  persistant/recurrent hoarseness , swallowing issues, chest pain, palpitations, edema, persistant/recurrent cough, hemoptysis, dyspnea (rest/exertional/paroxysmal nocturnal), gastrointestinal bleeding (melena, rectal bleeding), abdominal pain, significant heartburn, bowel changes, GU symptoms (dysuria, hematuria, incontinence), Gyn symptoms (abnormal  bleeding, pain),  syncope, focal weakness, memory loss, numbness & tingling, skin/hair/nail changes, abnormal bruising or bleeding, anxiety, or depression.   This visit occurred during the SARS-CoV-2 public health emergency.  Safety protocols were in place, including screening questions prior to the visit, additional usage of staff PPE, and extensive cleaning of exam room while observing appropriate contact time as indicated for disinfecting solutions.       Objective:   Physical Exam General Appearance:    Alert, cooperative, no distress, appears stated age  Head:    Normocephalic, without obvious abnormality, atraumatic  Eyes:    PERRL, conjunctiva/corneas clear, EOM's intact, fundi    benign, both eyes  Ears:    Normal TM's and external ear canals, both ears  Nose:   Deferred due to COVID  Throat:   Neck:   Supple, symmetrical, trachea midline, no adenopathy;    Thyroid: no enlargement/tenderness/nodules  Back:     Symmetric, no curvature, ROM normal, no CVA tenderness  Lungs:     Clear to auscultation bilaterally, respirations unlabored  Chest Wall:    No tenderness or  deformity   Heart:    Regular rate and rhythm, S1 and S2 normal, no murmur, rub   or gallop  Breast Exam:    Deferred to GYN  Abdomen:     Soft, non-tender, bowel sounds active all four quadrants,    no masses, no organomegaly  Genitalia:    Deferred to GYN  Rectal:    Extremities:   Extremities normal, atraumatic, no cyanosis or edema  Pulses:   2+ and symmetric all extremities  Skin:   Skin color, texture, turgor normal, no rashes or lesions  Lymph nodes:   Cervical, supraclavicular, and axillary nodes normal  Neurologic:   CNII-XII intact, normal strength, sensation and reflexes    throughout          Assessment & Plan:

## 2019-08-14 NOTE — Assessment & Plan Note (Signed)
Pt has hx of this.  Check labs and replete prn. 

## 2019-08-15 ENCOUNTER — Other Ambulatory Visit: Payer: Self-pay | Admitting: General Practice

## 2019-08-15 MED ORDER — VITAMIN D (ERGOCALCIFEROL) 1.25 MG (50000 UNIT) PO CAPS: 50000.0000 [IU] | ORAL_CAPSULE | ORAL | 0 refills | Status: DC

## 2019-11-06 ENCOUNTER — Other Ambulatory Visit: Payer: Self-pay | Admitting: Family Medicine

## 2020-08-14 ENCOUNTER — Encounter: Payer: 59 | Admitting: Family Medicine

## 2020-11-28 ENCOUNTER — Encounter: Payer: 59 | Admitting: Family Medicine

## 2020-12-30 ENCOUNTER — Encounter: Payer: Self-pay | Admitting: Family Medicine

## 2020-12-30 ENCOUNTER — Ambulatory Visit (INDEPENDENT_AMBULATORY_CARE_PROVIDER_SITE_OTHER): Payer: 59 | Admitting: Family Medicine

## 2020-12-30 ENCOUNTER — Other Ambulatory Visit: Payer: Self-pay

## 2020-12-30 VITALS — BP 117/80 | HR 81 | Temp 97.3°F | Resp 20 | Ht 65.0 in | Wt 206.6 lb

## 2020-12-30 DIAGNOSIS — E559 Vitamin D deficiency, unspecified: Secondary | ICD-10-CM

## 2020-12-30 DIAGNOSIS — Z0000 Encounter for general adult medical examination without abnormal findings: Secondary | ICD-10-CM | POA: Diagnosis not present

## 2020-12-30 DIAGNOSIS — E669 Obesity, unspecified: Secondary | ICD-10-CM

## 2020-12-30 DIAGNOSIS — Z114 Encounter for screening for human immunodeficiency virus [HIV]: Secondary | ICD-10-CM | POA: Diagnosis not present

## 2020-12-30 DIAGNOSIS — Z1159 Encounter for screening for other viral diseases: Secondary | ICD-10-CM

## 2020-12-30 LAB — HEPATIC FUNCTION PANEL
ALT: 11 U/L (ref 0–35)
AST: 12 U/L (ref 0–37)
Albumin: 4.2 g/dL (ref 3.5–5.2)
Alkaline Phosphatase: 54 U/L (ref 39–117)
Bilirubin, Direct: 0.1 mg/dL (ref 0.0–0.3)
Total Bilirubin: 0.4 mg/dL (ref 0.2–1.2)
Total Protein: 6.7 g/dL (ref 6.0–8.3)

## 2020-12-30 LAB — CBC WITH DIFFERENTIAL/PLATELET
Basophils Absolute: 0 10*3/uL (ref 0.0–0.1)
Basophils Relative: 0.4 % (ref 0.0–3.0)
Eosinophils Absolute: 0.1 10*3/uL (ref 0.0–0.7)
Eosinophils Relative: 2.1 % (ref 0.0–5.0)
HCT: 37 % (ref 36.0–46.0)
Hemoglobin: 12.4 g/dL (ref 12.0–15.0)
Lymphocytes Relative: 33.1 % (ref 12.0–46.0)
Lymphs Abs: 2.4 10*3/uL (ref 0.7–4.0)
MCHC: 33.5 g/dL (ref 30.0–36.0)
MCV: 84.8 fl (ref 78.0–100.0)
Monocytes Absolute: 0.6 10*3/uL (ref 0.1–1.0)
Monocytes Relative: 8.4 % (ref 3.0–12.0)
Neutro Abs: 4 10*3/uL (ref 1.4–7.7)
Neutrophils Relative %: 56 % (ref 43.0–77.0)
Platelets: 239 10*3/uL (ref 150.0–400.0)
RBC: 4.36 Mil/uL (ref 3.87–5.11)
RDW: 14 % (ref 11.5–15.5)
WBC: 7.1 10*3/uL (ref 4.0–10.5)

## 2020-12-30 LAB — VITAMIN D 25 HYDROXY (VIT D DEFICIENCY, FRACTURES): VITD: 9.47 ng/mL — ABNORMAL LOW (ref 30.00–100.00)

## 2020-12-30 LAB — LIPID PANEL
Cholesterol: 163 mg/dL (ref 0–200)
HDL: 61.8 mg/dL (ref >39.00)
LDL Cholesterol: 82 mg/dL (ref 0–99)
NonHDL: 101.02
Total CHOL/HDL Ratio: 3
Triglycerides: 95 mg/dL (ref 0.0–149.0)
VLDL: 19 mg/dL (ref 0.0–40.0)

## 2020-12-30 LAB — BASIC METABOLIC PANEL
BUN: 11 mg/dL (ref 6–23)
CO2: 24 mEq/L (ref 19–32)
Calcium: 9.1 mg/dL (ref 8.4–10.5)
Chloride: 102 mEq/L (ref 96–112)
Creatinine, Ser: 0.62 mg/dL (ref 0.40–1.20)
GFR: 124.69 mL/min (ref >60.00)
Glucose, Bld: 79 mg/dL (ref 70–99)
Potassium: 4 mEq/L (ref 3.5–5.1)
Sodium: 137 mEq/L (ref 135–145)

## 2020-12-30 LAB — TSH: TSH: 2.43 u[IU]/mL (ref 0.35–4.50)

## 2020-12-30 MED ORDER — VITAMIN D (ERGOCALCIFEROL) 1.25 MG (50000 UNIT) PO CAPS: 50000.0000 [IU] | ORAL_CAPSULE | ORAL | 0 refills | Status: DC

## 2020-12-30 NOTE — Progress Notes (Signed)
   Subjective:    Patient ID: Canary Brim, female    DOB: Mar 16, 1996, 25 y.o.   MRN: [id number]  HPI CPE- UTD on Tdap, COVID, pap.  Due for Hep C and HIV screens  Reviewed past medical, surgical, family and social histories.   Health Maintenance  Topic Date Due  . HIV Screening  Never done  . Hepatitis C Screening  Never done  . INFLUENZA VACCINE  03/16/2021  . PAP-Cervical Cytology Screening  02/11/2022  . PAP SMEAR-Modifier  02/11/2022  . TETANUS/TDAP  08/11/2028  . HPV VACCINES  Completed  . COVID-19 Vaccine  Completed      Review of Systems Patient reports no vision/ hearing changes, adenopathy,fever,  persistant/recurrent hoarseness , swallowing issues, chest pain, palpitations, edema, persistant/recurrent cough, hemoptysis, dyspnea (rest/exertional/paroxysmal nocturnal), gastrointestinal bleeding (melena, rectal bleeding), abdominal pain, significant heartburn, bowel changes, GU symptoms (dysuria, hematuria, incontinence), Gyn symptoms (abnormal  bleeding, pain),  syncope, focal weakness, memory loss, numbness & tingling, skin/hair/nail changes, abnormal bruising or bleeding, anxiety, or depression.   + 20 lb weight gain  This visit occurred during the SARS-CoV-2 public health emergency.  Safety protocols were in place, including screening questions prior to the visit, additional usage of staff PPE, and extensive cleaning of exam room while observing appropriate contact time as indicated for disinfecting solutions.       Objective:   Physical Exam General Appearance:    Alert, cooperative, no distress, appears stated age, obese  Head:    Normocephalic, without obvious abnormality, atraumatic  Eyes:    PERRL, conjunctiva/corneas clear, EOM's intact, fundi    benign, both eyes  Ears:    Normal TM's and external ear canals, both ears  Nose:   Deferred due to COVID  Throat:   Neck:   Supple, symmetrical, trachea midline, no adenopathy;    Thyroid: no  enlargement/tenderness/nodules  Back:     Symmetric, no curvature, ROM normal, no CVA tenderness  Lungs:     Clear to auscultation bilaterally, respirations unlabored  Chest Wall:    No tenderness or deformity   Heart:    Regular rate and rhythm, S1 and S2 normal, no murmur, rub   or gallop  Breast Exam:    Deferred to GYN  Abdomen:     Soft, non-tender, bowel sounds active all four quadrants,    no masses, no organomegaly  Genitalia:    Deferred to GYN  Rectal:    Extremities:   Extremities normal, atraumatic, no cyanosis or edema  Pulses:   2+ and symmetric all extremities  Skin:   Skin color, texture, turgor normal, no rashes or lesions  Lymph nodes:   Cervical, supraclavicular, and axillary nodes normal  Neurologic:   CNII-XII intact, normal strength, sensation and reflexes    throughout          Assessment & Plan:

## 2020-12-30 NOTE — Patient Instructions (Signed)
Follow up in 1 year or as needed We'll notify you of your lab results and make any changes if needed Continue to work on healthy diet and regular exercise- you can do it! Call with any questions or concerns Stay Safe!  Stay Healthy! Have a great summer!!! 

## 2020-12-30 NOTE — Assessment & Plan Note (Signed)
Pt has gained 20 lbs since last visit.  Encouraged healthy diet and regular exercise.  Check labs to risk stratify.  Will follow.

## 2020-12-30 NOTE — Assessment & Plan Note (Signed)
Pt has hx of this.  Check labs and replete prn. 

## 2020-12-30 NOTE — Assessment & Plan Note (Signed)
Pt's PE WNL w/ exception of obesity.  UTD on Tdap, COVID, pap.  Check labs.  Anticipatory guidance provided.

## 2020-12-31 LAB — HEPATITIS C ANTIBODY
Hepatitis C Ab: NONREACTIVE
SIGNAL TO CUT-OFF: 0.01 (ref <1.00)

## 2020-12-31 LAB — HIV ANTIBODY (ROUTINE TESTING W REFLEX): HIV 1&2 Ab, 4th Generation: NONREACTIVE

## 2021-01-01 ENCOUNTER — Encounter: Payer: 59 | Admitting: Family Medicine

## 2021-01-13 ENCOUNTER — Telehealth: Payer: Self-pay | Admitting: Family Medicine

## 2021-01-13 ENCOUNTER — Other Ambulatory Visit: Payer: Self-pay

## 2021-01-13 MED ORDER — VITAMIN D (ERGOCALCIFEROL) 1.25 MG (50000 UNIT) PO CAPS: 50000.0000 [IU] | ORAL_CAPSULE | ORAL | 0 refills | Status: AC

## 2021-01-13 NOTE — Telephone Encounter (Signed)
Medication sent to pharmacy  

## 2021-01-13 NOTE — Telephone Encounter (Signed)
Pt called in asking if the Vit D can be sent into the [facility] of [facility] on [address].  Please advise

## 2021-02-11 ENCOUNTER — Encounter: Payer: Self-pay | Admitting: *Deleted

## 2021-07-18 ENCOUNTER — Encounter: Payer: Self-pay | Admitting: Family Medicine

## 2022-03-15 ENCOUNTER — Ambulatory Visit (INDEPENDENT_AMBULATORY_CARE_PROVIDER_SITE_OTHER): Payer: 59 | Admitting: Family Medicine

## 2022-03-15 ENCOUNTER — Encounter: Payer: Self-pay | Admitting: Family Medicine

## 2022-03-15 VITALS — BP 130/80 | HR 75 | Temp 98.6°F | Resp 16 | Ht 66.0 in | Wt 225.4 lb

## 2022-03-15 DIAGNOSIS — E669 Obesity, unspecified: Secondary | ICD-10-CM | POA: Diagnosis not present

## 2022-03-15 DIAGNOSIS — Z0000 Encounter for general adult medical examination without abnormal findings: Secondary | ICD-10-CM

## 2022-03-15 DIAGNOSIS — E559 Vitamin D deficiency, unspecified: Secondary | ICD-10-CM | POA: Diagnosis not present

## 2022-03-15 LAB — CBC WITH DIFFERENTIAL/PLATELET
Basophils Absolute: 0 10*3/uL (ref 0.0–0.1)
Basophils Relative: 0.5 % (ref 0.0–3.0)
Eosinophils Absolute: 0.2 10*3/uL (ref 0.0–0.7)
Eosinophils Relative: 2.3 % (ref 0.0–5.0)
HCT: 38.7 % (ref 36.0–46.0)
Hemoglobin: 12.8 g/dL (ref 12.0–15.0)
Lymphocytes Relative: 27.7 % (ref 12.0–46.0)
Lymphs Abs: 1.8 10*3/uL (ref 0.7–4.0)
MCHC: 33.1 g/dL (ref 30.0–36.0)
MCV: 87 fl (ref 78.0–100.0)
Monocytes Absolute: 0.7 10*3/uL (ref 0.1–1.0)
Monocytes Relative: 10.7 % (ref 3.0–12.0)
Neutro Abs: 3.9 10*3/uL (ref 1.4–7.7)
Neutrophils Relative %: 58.8 % (ref 43.0–77.0)
Platelets: 242 10*3/uL (ref 150.0–400.0)
RBC: 4.44 Mil/uL (ref 3.87–5.11)
RDW: 14.1 % (ref 11.5–15.5)
WBC: 6.6 10*3/uL (ref 4.0–10.5)

## 2022-03-15 LAB — VITAMIN D 25 HYDROXY (VIT D DEFICIENCY, FRACTURES): VITD: 12.1 ng/mL — ABNORMAL LOW (ref 30.00–100.00)

## 2022-03-15 LAB — TSH: TSH: 1.4 u[IU]/mL (ref 0.35–5.50)

## 2022-03-15 MED ORDER — FLUCONAZOLE 150 MG PO TABS: 150.0000 mg | ORAL_TABLET | Freq: Once | ORAL | 0 refills | Status: AC

## 2022-03-15 NOTE — Assessment & Plan Note (Signed)
Check labs and replete prn. 

## 2022-03-15 NOTE — Progress Notes (Signed)
   Subjective:    Patient ID: Sara Hartman, female    DOB: [birth year], 26 y.o.   MRN: [id number]  HPI CPE- pt reports pap in 2021 ([facility]).  No concerns today.  Health Maintenance  Topic Date Due   PAP-Cervical Cytology Screening  02/11/2022   PAP SMEAR-Modifier  02/11/2022   INFLUENZA VACCINE  03/16/2022   TETANUS/TDAP  08/11/2028   HPV VACCINES  Completed   Hepatitis C Screening  Completed   HIV Screening  Completed   COVID-19 Vaccine  Discontinued      Review of Systems Patient reports no vision changes, adenopathy,fever, persistant/recurrent hoarseness , swallowing issues, chest pain, palpitations, edema, persistant/recurrent cough, hemoptysis, dyspnea (rest/exertional/paroxysmal nocturnal), gastrointestinal bleeding (melena, rectal bleeding), abdominal pain, significant heartburn, bowel changes, GU symptoms (dysuria, hematuria, incontinence), Gyn symptoms (abnormal  bleeding, pain),  syncope, focal weakness, memory loss, numbness & tingling, skin/hair/nail changes, abnormal bruising or bleeding, anxiety, or depression.   + 18 lb weight gain- exercising regularly, weight lifting 3-4x/week and walking + mild hearing loss    Objective:   Physical Exam General Appearance:    Alert, cooperative, no distress, appears stated age, obese  Head:    Normocephalic, without obvious abnormality, atraumatic  Eyes:    PERRL, conjunctiva/corneas clear, EOM's intact both eyes  Ears:    Normal TM's and external ear canals, both ears  Nose:   Nares normal, septum midline, mucosa normal, no drainage    or sinus tenderness  Throat:   Lips, mucosa, and tongue normal; teeth and gums normal  Neck:   Supple, symmetrical, trachea midline, no adenopathy;    Thyroid: no enlargement/tenderness/nodules  Back:     Symmetric, no curvature, ROM normal, no CVA tenderness  Lungs:     Clear to auscultation bilaterally, respirations unlabored  Chest Wall:    No tenderness or deformity   Heart:     Regular rate and rhythm, S1 and S2 normal, no murmur, rub   or gallop  Breast Exam:    Deferred to GYN  Abdomen:     Soft, non-tender, bowel sounds active all four quadrants,    no masses, no organomegaly  Genitalia:    Deferred to GYN  Rectal:    Extremities:   Extremities normal, atraumatic, no cyanosis or edema  Pulses:   2+ and symmetric all extremities  Skin:   Skin color, texture, turgor normal, no rashes or lesions  Lymph nodes:   Cervical, supraclavicular, and axillary nodes normal  Neurologic:   CNII-XII intact, normal strength, sensation and reflexes    throughout          Assessment & Plan:

## 2022-03-15 NOTE — Assessment & Plan Note (Signed)
Deteriorated.  Pt has gained 18 lbs since last visit.  She reports weight lifting 3-4x/week and walking regularly.  Encouraged her to continue and to focus on low carb/low sugar diet.  Check labs to risk stratify.  Will follow.

## 2022-03-15 NOTE — Assessment & Plan Note (Signed)
Pt's PE WNL w/ exception of obesity.  UTD on pap ([facility])- will attempt to get records.  Check labs.  Anticipatory guidance provided.

## 2022-03-15 NOTE — Patient Instructions (Signed)
Follow up in 1 year or as needed We'll notify you of your lab results and make any changes if needed Continue to work on healthy diet and regular exercise- you can do it! Drip some peroxide in your ears every so often to dissolve any wax Call with any questions or concerns Stay Safe!  Stay Healthy! Enjoy the rest of your summer!!!

## 2022-03-16 ENCOUNTER — Other Ambulatory Visit: Payer: Self-pay

## 2022-03-16 ENCOUNTER — Telehealth: Payer: Self-pay

## 2022-03-16 ENCOUNTER — Telehealth: Payer: Self-pay | Admitting: Family Medicine

## 2022-03-16 LAB — HEPATIC FUNCTION PANEL
ALT: 16 U/L (ref 0–35)
AST: 19 U/L (ref 0–37)
Albumin: 4.3 g/dL (ref 3.5–5.2)
Alkaline Phosphatase: 46 U/L (ref 39–117)
Bilirubin, Direct: 0 mg/dL (ref 0.0–0.3)
Total Bilirubin: 0.3 mg/dL (ref 0.2–1.2)
Total Protein: 7.5 g/dL (ref 6.0–8.3)

## 2022-03-16 LAB — LIPID PANEL
Cholesterol: 181 mg/dL (ref 0–200)
HDL: 60 mg/dL (ref >39.00)
LDL Cholesterol: 100 mg/dL — ABNORMAL HIGH (ref 0–99)
NonHDL: 121.47
Total CHOL/HDL Ratio: 3
Triglycerides: 108 mg/dL (ref 0.0–149.0)
VLDL: 21.6 mg/dL (ref 0.0–40.0)

## 2022-03-16 LAB — BASIC METABOLIC PANEL
BUN: 13 mg/dL (ref 6–23)
CO2: 26 mEq/L (ref 19–32)
Calcium: 9.6 mg/dL (ref 8.4–10.5)
Chloride: 104 mEq/L (ref 96–112)
Creatinine, Ser: 0.62 mg/dL (ref 0.40–1.20)
GFR: 123.64 mL/min (ref >60.00)
Glucose, Bld: 61 mg/dL — ABNORMAL LOW (ref 70–99)
Potassium: 4.2 mEq/L (ref 3.5–5.1)
Sodium: 138 mEq/L (ref 135–145)

## 2022-03-16 MED ORDER — VITAMIN D (ERGOCALCIFEROL) 1.25 MG (50000 UNIT) PO CAPS: 50000.0000 [IU] | ORAL_CAPSULE | ORAL | 12 refills | Status: AC

## 2022-03-16 MED ORDER — VITAMIN D (ERGOCALCIFEROL) 1.25 MG (50000 UNIT) PO CAPS: 50000.0000 [IU] | ORAL_CAPSULE | ORAL | 12 refills | Status: DC

## 2022-03-16 NOTE — Telephone Encounter (Signed)
Encourage patient to contact the pharmacy for refills or they can request refills through [facility]  (Please schedule appointment if patient has not been seen in over a year)    WHAT PHARMACY WOULD THEY LIKE THIS SENT TO: [facility] [address] [place] [phone]  MEDICATION NAME & DOSE: Vitamin D, ergocalciferol 1.25 (50000 unit)  NOTES/COMMENTS FROM PATIENT: Original refill sent to wrong pharmacy; pt is in [place] office please notify patient: It takes 48-72 hours to process rx refill requests Ask patient to call pharmacy to ensure rx is ready before heading there.

## 2022-03-16 NOTE — Telephone Encounter (Signed)
Sent Rx to pharmacy  

## 2022-03-16 NOTE — Telephone Encounter (Signed)
-----   Message from Sheliah Hatch, MD sent at 03/16/2022  7:30 AM EDT ----- Labs look great w/ exception of very low Vit D.  Based on this, we need to start prescription 50,000 units weekly x12 weeks in addition to daily OTC supplement of at least 2000 units.

## 2022-07-26 ENCOUNTER — Encounter: Payer: Self-pay | Admitting: Family Medicine

## 2022-07-28 ENCOUNTER — Telehealth: Payer: 59 | Admitting: Family Medicine

## 2022-07-28 ENCOUNTER — Encounter: Payer: Self-pay | Admitting: Family Medicine

## 2022-07-28 DIAGNOSIS — N760 Acute vaginitis: Secondary | ICD-10-CM | POA: Diagnosis not present

## 2022-07-28 MED ORDER — FLUCONAZOLE 150 MG PO TABS: 150.0000 mg | ORAL_TABLET | Freq: Once | ORAL | 3 refills | Status: AC

## 2022-07-28 NOTE — Progress Notes (Signed)
   Virtual Visit via Video   I connected with patient on 07/28/22 at  9:00 AM EST by a video enabled telemedicine application and verified that I am speaking with the correct person using two identifiers.  Location patient: Home Location provider: [place] [facility], Office Persons participating in the virtual visit: Patient, Provider, CMA Sheryle Hail C)  I discussed the limitations of evaluation and management by telemedicine and the availability of in person appointments. The patient expressed understanding and agreed to proceed.  Subjective:   HPI:   Vaginitis- 'i think I have a yeast infection'.  Pt reports similar sxs to when she had one in July.  + itching, uncomfortable to wipe.  First noticed on Sunday.  Denies discharge.  No burning w/ urination but discomfort when wiping.    ROS:   See pertinent positives and negatives per HPI.  Patient Active Problem List   Diagnosis Date Noted   Obesity (BMI 30-39.9) 08/14/2019   Vitamin D deficiency 08/07/2018   Acne 04/11/2017   Physical exam 08/24/2016   Pes planus 02/13/2012   Patellofemoral arthralgia of both knees 02/11/2012    Social History   Tobacco Use   Smoking status: Never   Smokeless tobacco: Never  Substance Use Topics   Alcohol use: Yes    Comment: socially at school    Current Outpatient Medications:    levonorgestrel (MIRENA) 20 MCG/24HR IUD, 1 each by Intrauterine route once., Disp: , Rfl:    Vitamin D, Ergocalciferol, (DRISDOL) 1.25 MG (50000 UNIT) CAPS capsule, Take 1 capsule (50,000 Units total) by mouth every 7 (seven) days., Disp: 12 capsule, Rfl: 12  No Known Allergies  Objective:   There were no vitals taken for this visit. AAOx3, NAD NCAT, EOMI No obvious CN deficits Coloring WNL Pt is able to speak clearly, coherently without shortness of breath or increased work of breathing.  Thought process is linear.  Mood is appropriate.   Assessment and Plan:   Vaginitis- pt has hx of similar this  past summer.  Rather than letting it go as long as it did last time, she wanted to intervene early.  Start Diflucan.  If no improvement, will need evaluation.  Pt expressed understanding and is in agreement w/ plan.    Neena Rhymes, MD 07/28/2022

## 2022-08-23 ENCOUNTER — Encounter: Payer: Self-pay | Admitting: Family Medicine

## 2022-08-31 ENCOUNTER — Telehealth: Payer: 59 | Admitting: Family Medicine

## 2022-09-10 ENCOUNTER — Encounter: Payer: Self-pay | Admitting: Family Medicine

## 2022-09-10 ENCOUNTER — Telehealth (INDEPENDENT_AMBULATORY_CARE_PROVIDER_SITE_OTHER): Payer: 59 | Admitting: Family Medicine

## 2022-09-10 DIAGNOSIS — E669 Obesity, unspecified: Secondary | ICD-10-CM | POA: Diagnosis not present

## 2022-09-10 MED ORDER — WEGOVY 0.25 MG/0.5ML ~~LOC~~ SOAJ: SUBCUTANEOUS | 3 refills | Status: AC

## 2022-09-10 NOTE — Progress Notes (Signed)
   Virtual Visit via Video   I connected with patient on 09/10/22 at  9:00 AM EST by a video enabled telemedicine application and verified that I am speaking with the correct person using two identifiers.  Location patient: Home Location provider: [facility], Office Persons participating in the virtual visit: Patient, Provider, [place] Marcille Blanco)  I discussed the limitations of evaluation and management by telemedicine and the availability of in person appointments. The patient expressed understanding and agreed to proceed.  Subjective:   HPI:   Obesity- pt reports she has struggled w/ weight for 'most of my life'.  'i've tried everything'.  Is exercising regularly, has been to nutritionists.  Pt is interested in using medication as a tool 'to help me get there'.  She spoke to her insurance company and she does have a weight loss benefit.  ROS:   See pertinent positives and negatives per HPI.  Patient Active Problem List   Diagnosis Date Noted   Obesity (BMI 30-39.9) 08/14/2019   Vitamin D deficiency 08/07/2018   Acne 04/11/2017   Physical exam 08/24/2016   Pes planus 02/13/2012   Patellofemoral arthralgia of both knees 02/11/2012    Social History   Tobacco Use   Smoking status: Never   Smokeless tobacco: Never  Substance Use Topics   Alcohol use: Yes    Comment: socially at school    Current Outpatient Medications:    amphetamine-dextroamphetamine (ADDERALL) 10 MG tablet, Take 10 mg by mouth daily with breakfast., Disp: , Rfl:    levonorgestrel (MIRENA) 20 MCG/24HR IUD, 1 each by Intrauterine route once., Disp: , Rfl:    Vitamin D, Ergocalciferol, (DRISDOL) 1.25 MG (50000 UNIT) CAPS capsule, Take 1 capsule (50,000 Units total) by mouth every 7 (seven) days., Disp: 12 capsule, Rfl: 12  No Known Allergies  Objective:   There were no vitals taken for this visit. AAOx3, NAD NCAT, EOMI No obvious CN deficits Coloring WNL Pt is able to speak clearly, coherently  without shortness of breath or increased work of breathing.  Thought process is linear.  Mood is appropriate.   Assessment and Plan:   Obesity- ongoing issue for pt.  She exercises regularly, eats well.  Has seen a nutritionist in the past.  Would like to start medication to jump start weight loss and get to goal and then maintain on her own.  Reviewed possible side effects and titration and she wants to proceed w/ JMEQAS.  Prescription sent to her pharmacy in [place] (pt home this weekend for mom's birthday)   Annye Asa, MD 09/10/2022

## 2022-09-20 ENCOUNTER — Encounter: Payer: Self-pay | Admitting: Family Medicine

## 2022-11-23 ENCOUNTER — Encounter: Payer: Self-pay | Admitting: Family Medicine
